# Patient Record
Sex: Male | Born: 1980 | Race: White | Hispanic: No | Marital: Single | Smoking: Never smoker
Health system: Southern US, Community
[De-identification: ages and names within clinical notes are randomized; demographics above are authoritative.]

## PROBLEM LIST (undated history)

## (undated) DIAGNOSIS — E109 Type 1 diabetes mellitus without complications: Secondary | ICD-10-CM

## (undated) HISTORY — PX: NASAL SEPTUM SURGERY: SHX37

---

## 2014-08-27 ENCOUNTER — Encounter: Payer: Self-pay | Admitting: *Deleted

## 2014-08-27 ENCOUNTER — Ambulatory Visit
Admission: EM | Admit: 2014-08-27 | Discharge: 2014-08-27 | Disposition: A | Discharge status: Home / Self Care | Payer: BLUE CROSS/BLUE SHIELD | Attending: Family Medicine | Admitting: Family Medicine

## 2014-08-27 DIAGNOSIS — R319 Hematuria, unspecified: Secondary | ICD-10-CM | POA: Diagnosis present

## 2014-08-27 DIAGNOSIS — M549 Dorsalgia, unspecified: Secondary | ICD-10-CM | POA: Diagnosis present

## 2014-08-27 DIAGNOSIS — M545 Low back pain, unspecified: Secondary | ICD-10-CM

## 2014-08-27 DIAGNOSIS — Z8052 Family history of malignant neoplasm of bladder: Secondary | ICD-10-CM

## 2014-08-27 LAB — URINALYSIS COMPLETE WITH MICROSCOPIC (ARMC ONLY)
Bilirubin Urine: NEGATIVE
Glucose, UA: NEGATIVE mg/dL
Hgb urine dipstick: NEGATIVE
Ketones, ur: NEGATIVE mg/dL
Leukocytes, UA: NEGATIVE
Nitrite: NEGATIVE
PH: 6 (ref 5.0–8.0)
Protein, ur: NEGATIVE mg/dL
Specific Gravity, Urine: 1.005 (ref 1.005–1.030)

## 2014-08-27 NOTE — ED Notes (Signed)
Pt states "ive had upset stomach, blood in urine and lower back pain, started about 2 days ago, blood in urine started today"

## 2014-09-01 ENCOUNTER — Encounter: Payer: Self-pay | Admitting: Physician Assistant

## 2014-09-01 NOTE — ED Provider Notes (Signed)
CSN: [id number]     Arrival date & time 08/27/14  1934 History   None    Chief Complaint  Patient presents with  . Back Pain  . Hematuria   (Consider location/radiation/quality/duration/timing/severity/associated sxs/prior Treatment) HPI33 yo M presents with his wife reporting low back aching and blood in his urine. He came in at her urging. Upset stomach was in passing- no change appetite, no vomiting, diarrhea or abdominal pain. Saw pink in urine this morning   History reviewed. No pertinent past medical history. Past Surgical History  Procedure Laterality Date  . Nasal septum surgery     History reviewed. No pertinent family history. History  Substance Use Topics  . Smoking status: Never Smoker   . Smokeless tobacco: Not on file  . Alcohol Use: Yes     Comment: social     Review of Systems Constitutional -afebrile Eyes-denies visual changes ENT- normal voice,denies sore throat CV-denies chest pain Resp-denies SOB GI- negative for nausea,vomiting, diarrhea GU- negative for dysuria MSK- positivie for  Low back pain, ambulatory Skin- denies acute changes Neuro- negative headache,focal weakness or numbness MSK- negative, ambulatory   Allergies  Review of patient's allergies indicates no known allergies.  Home Medications   Prior to Admission medications   Not on File   BP 115/76 mmHg  Pulse 62  Temp(Src) 97.8 F (36.6 C) (Tympanic)  Resp 16  Ht 5\' 9"  (1.753 m)  Wt 157 lb (71.215 kg)  BMI 23.17 kg/m2  SpO2 99% Physical Exam   Constitutional -alert and oriented,well appearing and in no acute distress Head-atraumatic, normocephalic Eyes- conjunctiva normal, EOMI ,conjugate gaze Nose- no congestion or rhinorrhea Mouth/throat- mucous membranes moist ,oropharynx non-erythematous Neck- supple without glandular enlargement CV- regular rate, grossly normal heart sounds,  Resp-no distress, normal respiratory effort,clear to auscultation bilaterally Back- No CVAT,  bilateral low back muscle tenderness to palpation; SLRs neg, DTRs equal bilat GI- soft,non-tender,no distention GU- not examined MSK- no tender, normal ROM, all extremities, ambulatory, self-care Neuro- normal speech and language, no gross focal neurological deficit appreciated, no gait instability, Skin-warm,dry ,intact; no rash noted Psych-mood and affect grossly normal; speech and behavior grossly normal ED Course  Procedures (including critical care time) Labs Review Labs Reviewed  URINALYSIS COMPLETEWITH MICROSCOPIC ([facility] ONLY) - Abnormal; Notable for the following:    Squamous Epithelial / LPF 0-5 (*)    All other components within normal limits    Imaging Review No results found.  Low back muscle discomfort bilateral,equal, mild identified. Normal and negative urinalysis reviewed with the couple at which time a tumble of information evolved with multiple family members reported as having bladder cancer,some at a relatively young age.  They are reasurred that his physical exam today did not indicate kidney type pain and the urine was clear. They are however strongly encouraged to get some real information from parents and peripheral relatives- is there a syndrome identified ? Has genetic testing been done? Which family members have been affected and in what way ? He is also given urine specimen cups-should he note hematuria on a subsequent void request he catch a specimen, transport it to his PCP office for evaluation .Have also asked them to discuss a Urology referral with PCP to review the information they gather from family members and to recommend diagnostics  if indicated. Encourage them to be proactive in data gathering and baseline consultations        1. Bilateral low back pain without sciatica   2. FHx: bladder  cancer    Greater than half of today's visit was spent in face to face consultation with the patient and his spouse. Information,patient education, coordination  of care and recommendations reviewed. Diagnosis and treatment discussed- low back, ibuprofen, stretches, heat/ice, exercise. Possible hematuria and family history as noted above. Questions fielded, expectations and recommendations reviewed. Patient expresses understanding. Will return to [facility] with questions, concern or exacerbation.     Rae Halsted, PA-C 09/01/14 1510

## 2020-04-08 ENCOUNTER — Other Ambulatory Visit: Payer: Self-pay

## 2020-04-08 ENCOUNTER — Ambulatory Visit
Admission: EM | Admit: 2020-04-08 | Discharge: 2020-04-08 | Disposition: A | Discharge status: Home / Self Care | Payer: BLUE CROSS/BLUE SHIELD | Attending: Family Medicine | Admitting: Family Medicine

## 2020-04-08 DIAGNOSIS — M545 Low back pain, unspecified: Secondary | ICD-10-CM | POA: Diagnosis present

## 2020-04-08 HISTORY — DX: Type 1 diabetes mellitus without complications: E10.9

## 2020-04-08 LAB — URINALYSIS, COMPLETE (UACMP) WITH MICROSCOPIC
Bacteria, UA: NONE SEEN
Bilirubin Urine: NEGATIVE
Glucose, UA: >1000 mg/dL — AB
Hgb urine dipstick: NEGATIVE
Ketones, ur: NEGATIVE mg/dL
Leukocytes,Ua: NEGATIVE
Nitrite: NEGATIVE
Protein, ur: NEGATIVE mg/dL
Specific Gravity, Urine: 1.025 (ref 1.005–1.030)
Squamous Epithelial / LPF: NONE SEEN (ref 0–5)
pH: 6.5 (ref 5.0–8.0)

## 2020-04-08 MED ORDER — MELOXICAM 15 MG PO TABS: 15.0000 mg | ORAL_TABLET | Freq: Every day | ORAL | 0 refills | Status: DC | PRN

## 2020-04-08 MED ORDER — TIZANIDINE HCL 4 MG PO TABS: 4.0000 mg | ORAL_TABLET | Freq: Three times a day (TID) | ORAL | 0 refills | Status: DC | PRN

## 2020-04-08 NOTE — Discharge Instructions (Signed)
Rest.  Heat.  Medication as prescribed.  Take care  Dr. Kimo Bancroft  

## 2020-04-08 NOTE — ED Provider Notes (Signed)
[facility]    CSN: [id number] Arrival date & time: 04/08/20  1215      History   Chief Complaint Chief Complaint  Patient presents with  . Back Pain   HPI  40 year old male presents with back pain.  Started on Wednesday.  He reports bilateral low back pain.  No radicular symptoms.  No fall, trauma, injury.  No heavy lifting.  Patient concerned about the possibility of UTI/kidney issues as he is diabetic.  He would like his urine checked today.  He has no urinary symptoms.  He has taken naproxen without relief.  No other complaints at this time.  Past Medical History:  Diagnosis Date  . Diabetes type 1, controlled (HCC)    Past Surgical History:  Procedure Laterality Date  . NASAL SEPTUM SURGERY     Home Medications    Prior to Admission medications   Medication Sig Start Date End Date Taking? Authorizing Provider  meloxicam (MOBIC) 15 MG tablet Take 1 tablet (15 mg total) by mouth daily as needed for pain. 04/08/20  Yes Mack Alvidrez G, DO  metFORMIN (GLUCOPHAGE) 500 MG tablet Take 1 tablet by mouth in the morning and at bedtime. 12/29/18  Yes [provider]  tiZANidine (ZANAFLEX) 4 MG tablet Take 1 tablet (4 mg total) by mouth every 8 (eight) hours as needed for muscle spasms. 04/08/20  Yes Senai Kingsley G, DO  TRESIBA FLEXTOUCH 100 UNIT/ML FlexTouch Pen Inject 20 Units into the skin in the morning. 03/12/20  Yes [provider]    Family History No family history on file.  Social History Social History   Tobacco Use  . Smoking status: Never Smoker  . Smokeless tobacco: Never Used  Vaping Use  . Vaping Use: Never used  Substance Use Topics  . Alcohol use: Yes    Comment: social      Allergies   Patient has no known allergies.   Review of Systems Review of Systems  Constitutional: Negative.   Musculoskeletal: Positive for back pain.   Physical Exam Triage Vital Signs ED Triage Vitals  Enc Vitals Group     BP 04/08/20 1238  116/71     Pulse Rate 04/08/20 1238 61     Resp 04/08/20 1238 18     Temp 04/08/20 1238 98.4 F (36.9 C)     Temp Source 04/08/20 1238 Oral     SpO2 04/08/20 1238 99 %     Weight 04/08/20 1235 165 lb (74.8 kg)     Height 04/08/20 1235 5\' 9"  (1.753 m)     Head Circumference --      Peak Flow --      Pain Score 04/08/20 1235 6     Pain Loc --      Pain Edu? --      Excl. in GC? --    No data found.  Updated Vital Signs BP 116/71 (BP Location: Left Arm)   Pulse 61   Temp 98.4 F (36.9 C) (Oral)   Resp 18   Ht 5\' 9"  (1.753 m)   Wt 74.8 kg   SpO2 99%   BMI 24.37 kg/m   Visual Acuity Right Eye Distance:   Left Eye Distance:   Bilateral Distance:    Right Eye Near:   Left Eye Near:    Bilateral Near:     Physical Exam Constitutional:      General: He is not in acute distress.    Appearance: Normal appearance. He  is not ill-appearing.  HENT:     Head: Normocephalic and atraumatic.  Cardiovascular:     Rate and Rhythm: Normal rate and regular rhythm.     Heart sounds: No murmur heard.   Pulmonary:     Effort: Pulmonary effort is normal.     Breath sounds: Normal breath sounds. No wheezing, rhonchi or rales.  Musculoskeletal:     Comments: Negative straight leg raise  Neurological:     Mental Status: He is alert.  Psychiatric:        Mood and Affect: Mood normal.        Behavior: Behavior normal.    UC Treatments / Results  Labs (all labs ordered are listed, but only abnormal results are displayed) Labs Reviewed  URINALYSIS, COMPLETE (UACMP) WITH MICROSCOPIC - Abnormal; Notable for the following components:      Result Value   Color, Urine STRAW (*)    Glucose, UA >1,000 (*)    All other components within normal limits    EKG   Radiology No results found.  Procedures Procedures (including critical care time)  Medications Ordered in UC Medications - No data to display  Initial Impression / Assessment and Plan / UC Course  I have reviewed the  triage vital signs and the nursing notes.  Pertinent labs & imaging results that were available during my care of the patient were reviewed by me and considered in my medical decision making (see chart for details).    40 year old male presents with back pain.  Urinalysis not consistent with UTI.  Glucosuria was noted.  Advised that he needs to increase his insulin.  Needs follow-up with endocrinology.  Treating low back pain with Mobic and Zanaflex.  Final Clinical Impressions(s) / UC Diagnoses   Final diagnoses:  Acute bilateral low back pain without sciatica     Discharge Instructions     Rest.  Heat.  Medication as prescribed.  Take care  Dr. Adriana Simas    ED Prescriptions    Medication Sig Dispense Auth. Provider   meloxicam (MOBIC) 15 MG tablet Take 1 tablet (15 mg total) by mouth daily as needed for pain. 30 tablet Paola Flynt G, DO   tiZANidine (ZANAFLEX) 4 MG tablet Take 1 tablet (4 mg total) by mouth every 8 (eight) hours as needed for muscle spasms. 30 tablet Tommie Sams, DO     PDMP not reviewed this encounter.   Tommie Sams, [place] 04/08/20 1404

## 2020-04-08 NOTE — ED Triage Notes (Signed)
Pt c/o lower back pain radiating into both hips since this past Wednesday. Pt states sitting increased his pain level. Pt denies any urinary symptoms. Pt denies any known injuries or heavy lifting. Pt is DM1 and is concerned about his kidneys.

## 2020-04-14 ENCOUNTER — Ambulatory Visit
Admission: EM | Admit: 2020-04-14 | Discharge: 2020-04-14 | Disposition: A | Discharge status: Home / Self Care | Payer: BLUE CROSS/BLUE SHIELD | Attending: Family Medicine | Admitting: Family Medicine

## 2020-04-14 ENCOUNTER — Ambulatory Visit (INDEPENDENT_AMBULATORY_CARE_PROVIDER_SITE_OTHER): Payer: BLUE CROSS/BLUE SHIELD

## 2020-04-14 ENCOUNTER — Other Ambulatory Visit: Payer: Self-pay

## 2020-04-14 DIAGNOSIS — N50819 Testicular pain, unspecified: Secondary | ICD-10-CM | POA: Insufficient documentation

## 2020-04-14 DIAGNOSIS — N50811 Right testicular pain: Secondary | ICD-10-CM | POA: Insufficient documentation

## 2020-04-14 DIAGNOSIS — N451 Epididymitis: Secondary | ICD-10-CM | POA: Insufficient documentation

## 2020-04-14 LAB — URINALYSIS, COMPLETE (UACMP) WITH MICROSCOPIC
Bacteria, UA: NONE SEEN
Bilirubin Urine: NEGATIVE
Glucose, UA: 100 mg/dL — AB
Hgb urine dipstick: NEGATIVE
Ketones, ur: NEGATIVE mg/dL
Leukocytes,Ua: NEGATIVE
Nitrite: NEGATIVE
Protein, ur: NEGATIVE mg/dL
Specific Gravity, Urine: 1.02 (ref 1.005–1.030)
Squamous Epithelial / LPF: NONE SEEN (ref 0–5)
WBC, UA: NONE SEEN WBC/hpf (ref 0–5)
pH: 5.5 (ref 5.0–8.0)

## 2020-04-14 MED ORDER — LEVOFLOXACIN 500 MG PO TABS: 500.0000 mg | ORAL_TABLET | Freq: Every day | ORAL | 0 refills | Status: DC

## 2020-04-14 NOTE — ED Provider Notes (Signed)
[facility]    CSN: [id number] Arrival date & time: 04/14/20  0941      History   Chief Complaint Chief Complaint  Patient presents with  . Testicle Pain    right   HPI   40 year old male presents with right testicle pain.  Patient recently seen for low back pain. Patient reports that this improved but then worsened again as of yesterday. He has now developed right testicle pain and swelling of the past 2 days. States that his pain is severe. Worse with any sort of pressure or touch applied to the right testicle. No relieving factors. No fever. No urinary symptoms. No other complaints.  Past Medical History:  Diagnosis Date  . Diabetes type 1, controlled (HCC)    Past Surgical History:  Procedure Laterality Date  . NASAL SEPTUM SURGERY     Home Medications    Prior to Admission medications   Medication Sig Start Date End Date Taking? Authorizing Provider  levofloxacin (LEVAQUIN) 500 MG tablet Take 1 tablet (500 mg total) by mouth daily. 04/14/20  Yes Ricketta Colantonio G, DO  meloxicam (MOBIC) 15 MG tablet Take 1 tablet (15 mg total) by mouth daily as needed for pain. 04/08/20   Tommie Sams, DO  metFORMIN (GLUCOPHAGE) 500 MG tablet Take 1 tablet by mouth in the morning and at bedtime. 12/29/18   [provider]  tiZANidine (ZANAFLEX) 4 MG tablet Take 1 tablet (4 mg total) by mouth every 8 (eight) hours as needed for muscle spasms. 04/08/20   Jackolyn Geron G, DO  TRESIBA FLEXTOUCH 100 UNIT/ML FlexTouch Pen Inject 20 Units into the skin in the morning. 03/12/20   [provider]   Social History Social History   Tobacco Use  . Smoking status: Never Smoker  . Smokeless tobacco: Never Used  Vaping Use  . Vaping Use: Never used  Substance Use Topics  . Alcohol use: Yes    Comment: social      Allergies   Patient has no known allergies.   Review of Systems Review of Systems  Genitourinary: Positive for testicular pain.  Musculoskeletal:  Positive for back pain.   Physical Exam Triage Vital Signs ED Triage Vitals  Enc Vitals Group     BP 04/14/20 1005 123/75     Pulse Rate 04/14/20 1005 (!) 55     Resp 04/14/20 1005 18     Temp 04/14/20 1005 98 F (36.7 C)     Temp Source 04/14/20 1005 Oral     SpO2 04/14/20 1005 100 %     Weight 04/14/20 1004 165 lb (74.8 kg)     Height 04/14/20 1004 5\' 9"  (1.753 m)     Head Circumference --      Peak Flow --      Pain Score 04/14/20 1003 10     Pain Loc --      Pain Edu? --      Excl. in GC? --    Updated Vital Signs BP 123/75 (BP Location: Left Arm)   Pulse (!) 55   Temp 98 F (36.7 C) (Oral)   Resp 18   Ht 5\' 9"  (1.753 m)   Wt 74.8 kg   SpO2 100%   BMI 24.37 kg/m   Visual Acuity Right Eye Distance:   Left Eye Distance:   Bilateral Distance:    Right Eye Near:   Left Eye Near:    Bilateral Near:     Physical Exam Vitals and  nursing note reviewed.  Constitutional:      General: He is not in acute distress.    Appearance: Normal appearance. He is not ill-appearing.  HENT:     Head: Normocephalic and atraumatic.  Eyes:     General:        Right eye: No discharge.        Left eye: No discharge.     Conjunctiva/sclera: Conjunctivae normal.  Pulmonary:     Effort: Pulmonary effort is normal.     Breath sounds: Normal breath sounds. No wheezing, rhonchi or rales.  Genitourinary:    Comments: Right testicle with tenderness to palpation. Patient with tenderness over the epididymis. Mild swelling. Neurological:     Mental Status: He is alert.  Psychiatric:        Mood and Affect: Mood normal.        Behavior: Behavior normal.    UC Treatments / Results  Labs (all labs ordered are listed, but only abnormal results are displayed) Labs Reviewed  URINALYSIS, COMPLETE (UACMP) WITH MICROSCOPIC - Abnormal; Notable for the following components:      Result Value   Glucose, UA 100 (*)    All other components within normal limits  URINE CULTURE     EKG   Radiology No results found.  Procedures Procedures (including critical care time)  Medications Ordered in UC Medications - No data to display  Initial Impression / Assessment and Plan / UC Course  I have reviewed the triage vital signs and the nursing notes.  Pertinent labs & imaging results that were available during my care of the patient were reviewed by me and considered in my medical decision making (see chart for details).    40 year old male presents with right testicular pain. Ultrasound revealed epididymitis. Treating with Levaquin. Supportive care. Work note given.  Final Clinical Impressions(s) / UC Diagnoses   Final diagnoses:  Testicle pain  Epididymitis   Discharge Instructions   None    ED Prescriptions    Medication Sig Dispense Auth. Provider   levofloxacin (LEVAQUIN) 500 MG tablet Take 1 tablet (500 mg total) by mouth daily. 10 tablet Tommie Sams, DO     PDMP not reviewed this encounter.   Tommie Sams, [place] 04/14/20 1122

## 2020-04-14 NOTE — ED Triage Notes (Signed)
Pt c/o continued intermittent lower back pain and now has developed groin pain/pressure mostly in the right testicle. Pt also reports swelling and a lump on the back side. Pt denies f/n/v/d or other symptoms.

## 2020-04-16 LAB — URINE CULTURE: Culture: NO GROWTH

## 2020-05-26 ENCOUNTER — Telehealth: Payer: Self-pay | Admitting: Family Medicine

## 2020-05-26 MED ORDER — LEVOFLOXACIN 500 MG PO TABS
500.0000 mg | ORAL_TABLET | Freq: Every day | ORAL | 0 refills | Status: DC
Start: 2020-05-26 — End: 2022-03-07

## 2020-05-26 NOTE — Telephone Encounter (Signed)
Recurrent testicle pain concerning for epididymitis. Starting Levaquin. If continues to have trouble, needs to see Urology.  Everlene Other DO [facility]

## 2021-06-17 IMAGING — US US SCROTUM W/ DOPPLER COMPLETE
1 series · 14 of 25 positions shown · non-contrast
Comparison: None.

CLINICAL DATA: Right scrotal pain, lump

EXAM:
SCROTAL ULTRASOUND
DOPPLER ULTRASOUND OF THE TESTICLES
TECHNIQUE: Complete ultrasound examination of the testicles, epididymis, and
other scrotal structures was performed. Color and spectral Doppler
ultrasound were also utilized to evaluate blood flow to the
testicles.

[Series 1: us scrotum w/ doppler complete · 0.08mm/px · 14 of 45 slices shown]
[im 1/45]
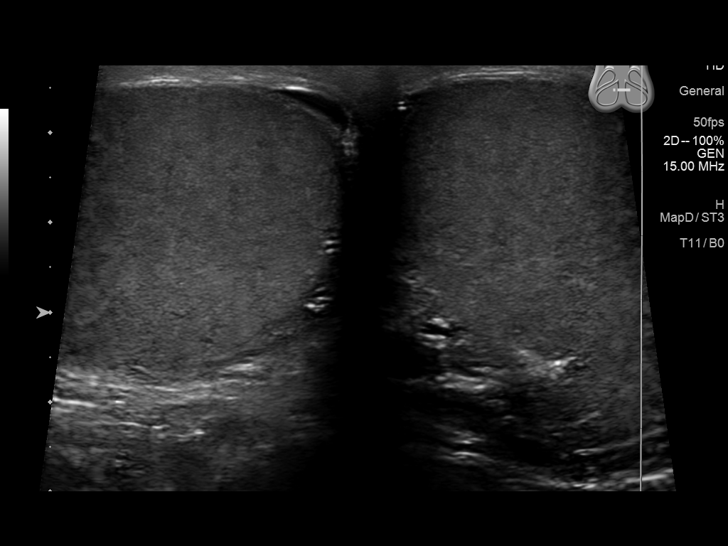
[im 4/45]
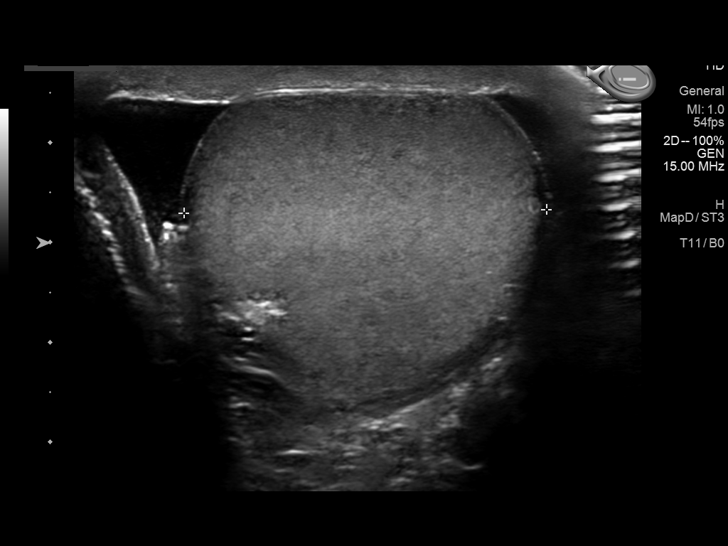
[im 8/45]
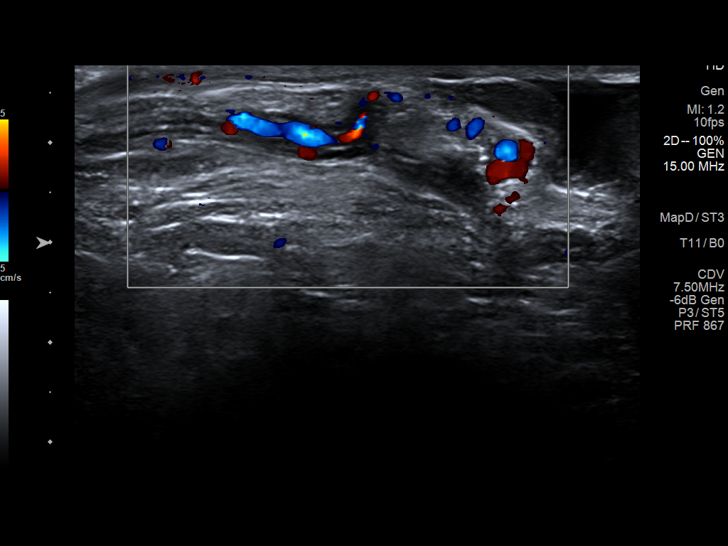
[im 12/45]
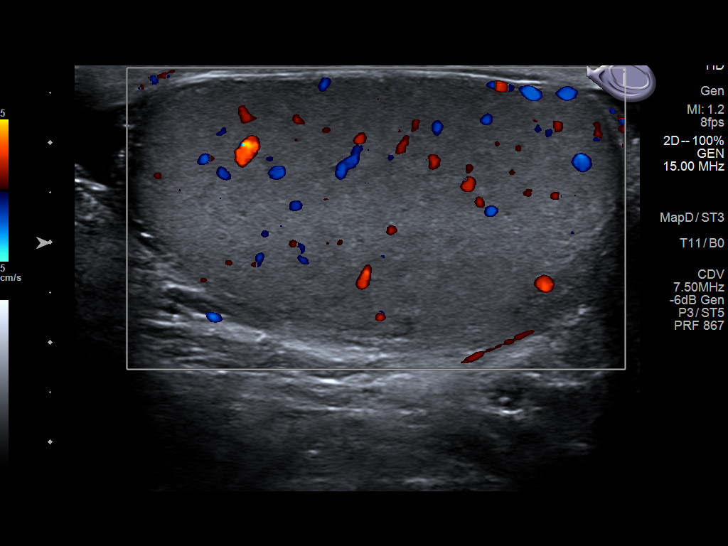
[im 15/45]
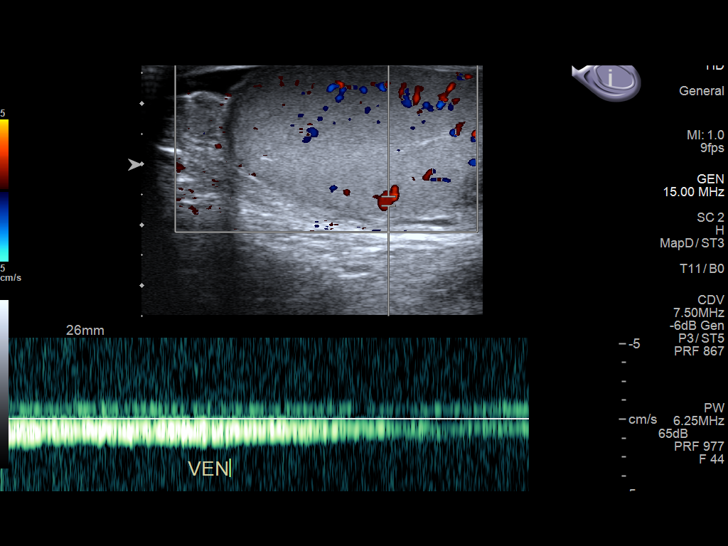
[im 17/45]
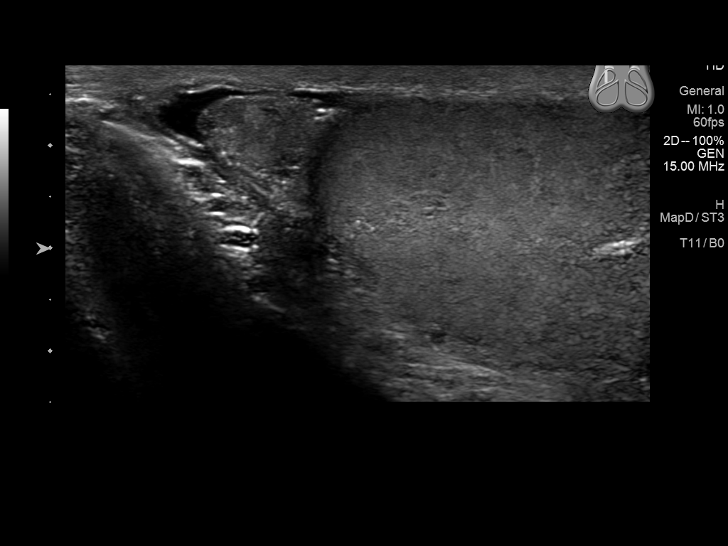
[im 21/45]
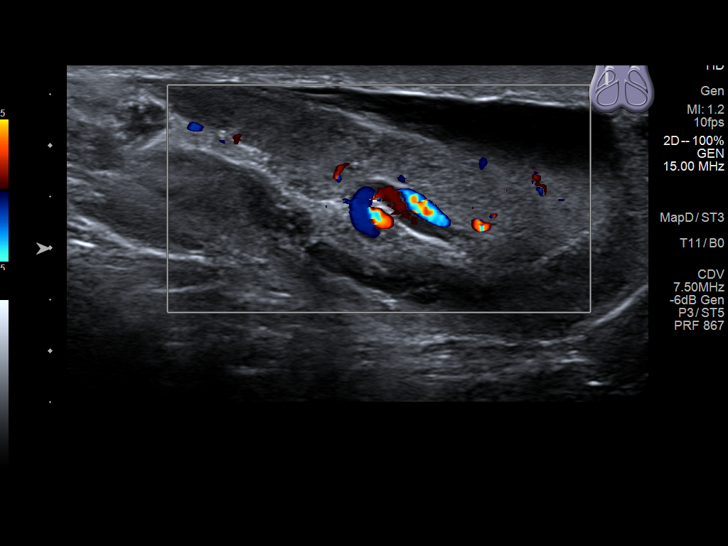
[im 24/45]
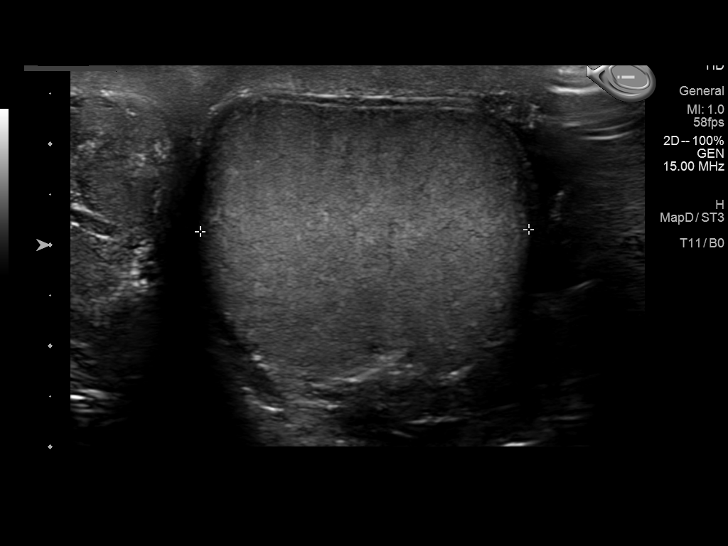
[im 28/45]
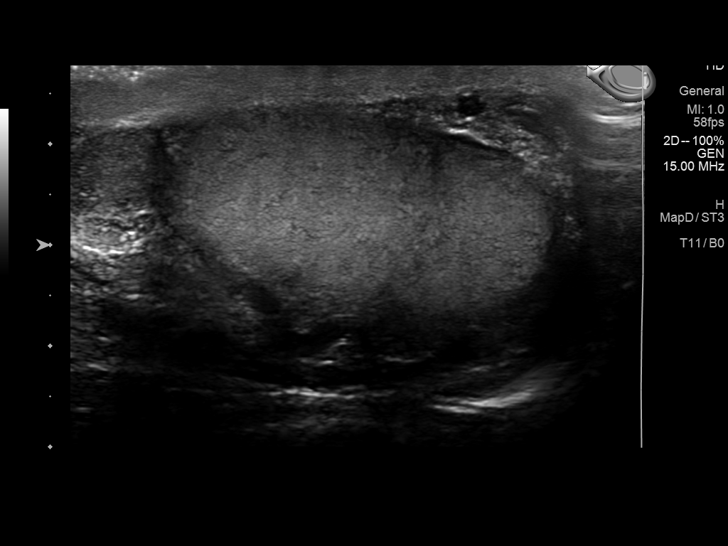
[im 30/45]
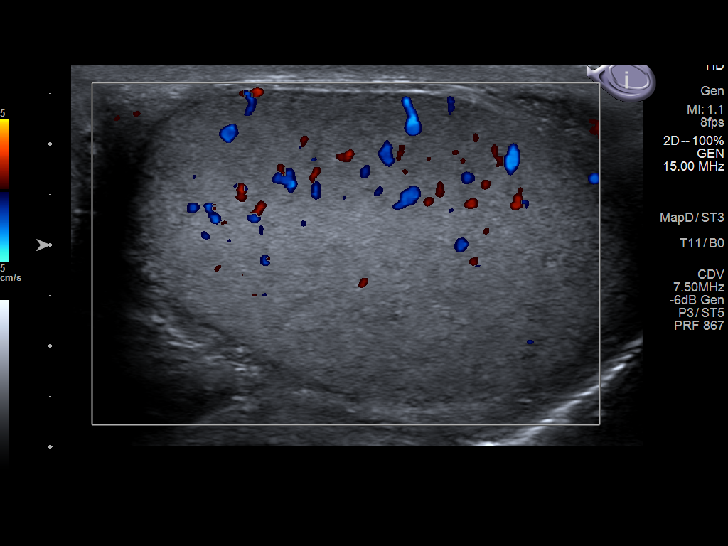
[im 34/45]
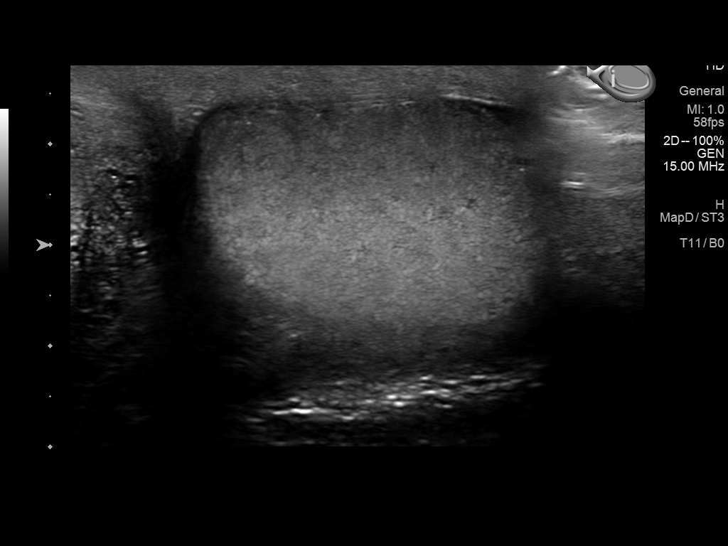
[im 37/45]
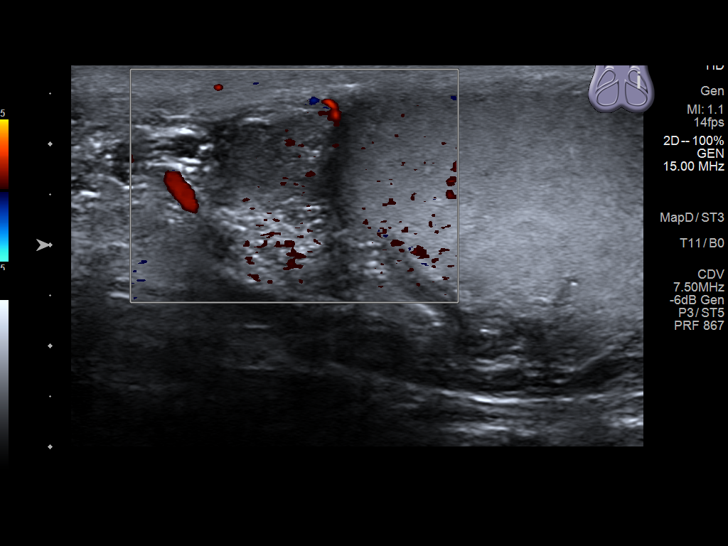
[im 41/45]
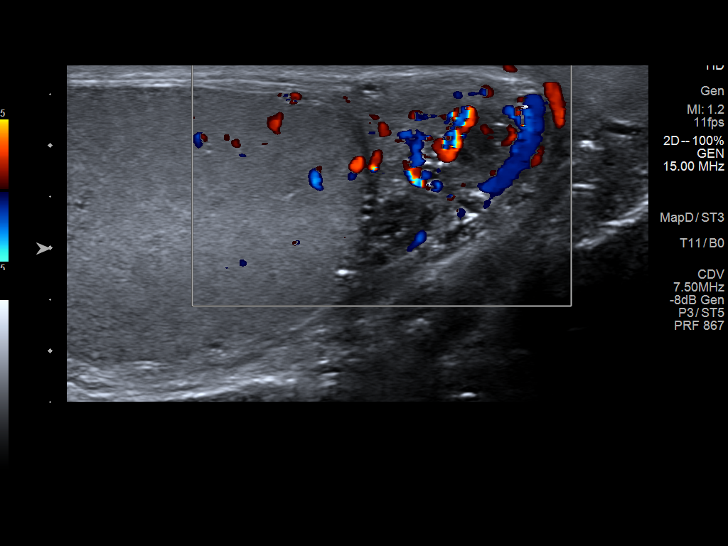
[im 45/45]
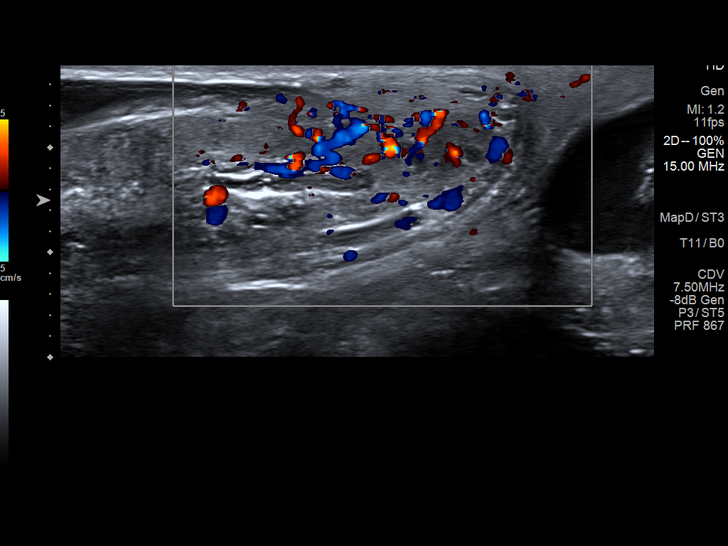

[14 of 25 positions shown; findings below may reference images not displayed]

FINDINGS: Right testicle

Measurements: 4.8 x 2.8 x 3.6 cm. No mass or microlithiasis
visualized.

Left testicle

Measurements: 5.0 x 3.1 x 3.3 cm. No mass or microlithiasis
visualized.

Right epididymis: Increased vascularity within the tail of the
epididymis which is prominent, question epididymitis.

Left epididymis:  Normal in size and appearance.

Hydrocele:  Small bilateral

Varicocele:  None visualized.

Pulsed Doppler interrogation of both testes demonstrates normal low
resistance arterial and venous waveforms bilaterally.
IMPRESSION: Prominent epididymal tail on the right with increased vascularity
concerning for epididymitis.

No focal testicular abnormality or evidence of torsion.

Small bilateral hydroceles.

## 2022-03-07 ENCOUNTER — Ambulatory Visit
Admission: EM | Admit: 2022-03-07 | Discharge: 2022-03-07 | Disposition: A | Discharge status: Home / Self Care | Payer: BLUE CROSS/BLUE SHIELD | Attending: Physician Assistant | Admitting: Physician Assistant

## 2022-03-07 DIAGNOSIS — R0981 Nasal congestion: Secondary | ICD-10-CM | POA: Diagnosis not present

## 2022-03-07 DIAGNOSIS — R051 Acute cough: Secondary | ICD-10-CM | POA: Diagnosis not present

## 2022-03-07 DIAGNOSIS — U071 COVID-19: Secondary | ICD-10-CM | POA: Insufficient documentation

## 2022-03-07 LAB — SARS CORONAVIRUS 2 BY RT PCR: SARS Coronavirus 2 by RT PCR: POSITIVE — AB

## 2022-03-07 MED ORDER — MOLNUPIRAVIR 200 MG PO CAPS: 4.0000 | ORAL_CAPSULE | Freq: Two times a day (BID) | ORAL | 0 refills | Status: AC

## 2022-03-07 MED ORDER — BENZONATATE 200 MG PO CAPS: 200.0000 mg | ORAL_CAPSULE | Freq: Three times a day (TID) | ORAL | 0 refills | Status: AC | PRN

## 2022-03-07 NOTE — ED Provider Notes (Signed)
[facility]    CSN: [id number] Arrival date & time: 03/07/22  0806      History   Chief Complaint Chief Complaint  Patient presents with   Cough   Nasal Congestion    HPI Jesus Shepherd is a 42 y.o. male presenting for fatigue, cough, congestion that began yesterday.  He has been taking ibuprofen so he has not noticed a fever.  He denies sore throat or shortness of breath.  He reports he was exposed to the flu last week by his daughter.  He has been taking over-the-counter medication for symptoms.  He is a type I diabetic.  No other complaints or concerns.  HPI  Past Medical History:  Diagnosis Date   Diabetes type 1, controlled (HCC)     There are no problems to display for this patient.   Past Surgical History:  Procedure Laterality Date   NASAL SEPTUM SURGERY         Home Medications    Prior to Admission medications   Medication Sig Start Date End Date Taking? Authorizing Provider  benzonatate (TESSALON) 200 MG capsule Take 1 capsule (200 mg total) by mouth 3 (three) times daily as needed for cough. 03/07/22  Yes Eusebio Friendly B, PA-C  Continuous Blood Gluc Sensor (DEXCOM G6 SENSOR) MISC Replace sensor every 10 days 02/27/22  Yes [provider]  molnupiravir EUA (LAGEVRIO) 200 MG CAPS capsule Take 4 capsules (800 mg total) by mouth 2 (two) times daily for 5 days. 03/07/22 03/12/22 Yes Shirlee Latch, PA-C  metFORMIN (GLUCOPHAGE) 500 MG tablet Take 1 tablet by mouth in the morning and at bedtime. 12/29/18   [provider]  TRESIBA FLEXTOUCH 100 UNIT/ML FlexTouch Pen Inject 20 Units into the skin in the morning. 03/12/20   [provider]    Family History History reviewed. No pertinent family history.  Social History Social History   Tobacco Use   Smoking status: Never   Smokeless tobacco: Never  Vaping Use   Vaping Use: Never used  Substance Use Topics   Alcohol use: Yes    Comment: social      Allergies   Patient  has no known allergies.   Review of Systems Review of Systems  Constitutional:  Positive for fatigue. Negative for fever.  HENT:  Positive for congestion and rhinorrhea. Negative for sinus pressure, sinus pain and sore throat.   Respiratory:  Positive for cough. Negative for shortness of breath.   Gastrointestinal:  Negative for abdominal pain, diarrhea, nausea and vomiting.  Musculoskeletal:  Negative for myalgias.  Neurological:  Negative for weakness, light-headedness and headaches.  Hematological:  Negative for adenopathy.     Physical Exam Triage Vital Signs ED Triage Vitals  Enc Vitals Group     BP 03/07/22 0826 137/85     Pulse Rate 03/07/22 0826 85     Resp 03/07/22 0826 16     Temp 03/07/22 0826 98.6 F (37 C)     Temp Source 03/07/22 0826 Oral     SpO2 03/07/22 0826 96 %     Weight --      Height --      Head Circumference --      Peak Flow --      Pain Score 03/07/22 0825 0     Pain Loc --      Pain Edu? --      Excl. in GC? --    No data found.  Updated Vital Signs BP  137/85 (BP Location: Right Arm)   Pulse 85   Temp 98.6 F (37 C) (Oral)   Resp 16   SpO2 96%       Physical Exam Vitals and nursing note reviewed.  Constitutional:      General: He is not in acute distress.    Appearance: Normal appearance. He is well-developed.  HENT:     Head: Normocephalic and atraumatic.     Nose: Congestion present.     Mouth/Throat:     Mouth: Mucous membranes are moist.     Pharynx: Oropharynx is clear. Posterior oropharyngeal erythema present.  Eyes:     General: No scleral icterus.    Conjunctiva/sclera: Conjunctivae normal.  Cardiovascular:     Rate and Rhythm: Normal rate and regular rhythm.     Heart sounds: Normal heart sounds.  Pulmonary:     Effort: Pulmonary effort is normal. No respiratory distress.     Breath sounds: Normal breath sounds.  Musculoskeletal:     Cervical back: Neck supple.  Skin:    General: Skin is warm and dry.      Capillary Refill: Capillary refill takes less than 2 seconds.  Neurological:     General: No focal deficit present.     Mental Status: He is alert. Mental status is at baseline.     Motor: No weakness.     Gait: Gait normal.  Psychiatric:        Mood and Affect: Mood normal.        Behavior: Behavior normal.      UC Treatments / Results  Labs (all labs ordered are listed, but only abnormal results are displayed) Labs Reviewed  SARS CORONAVIRUS 2 BY RT PCR - Abnormal; Notable for the following components:      Result Value   SARS Coronavirus 2 by RT PCR POSITIVE (*)    All other components within normal limits    EKG   Radiology No results found.  Procedures Procedures (including critical care time)  Medications Ordered in UC Medications - No data to display  Initial Impression / Assessment and Plan / UC Course  I have reviewed the triage vital signs and the nursing notes.  Pertinent labs & imaging results that were available during my care of the patient were reviewed by me and considered in my medical decision making (see chart for details).   42 year old type I diabetic presents for onset of fatigue, cough and congestion yesterday.  Unknown if having a fever.  Has been taking Motrin regularly.  He has been exposed to flu last week by his child.  Vitals normal and stable.  He is overall well-appearing.  On exam he has nasal congestion and mild posterior pharyngeal erythema.  Chest clear to auscultation.  PCR COVID test obtained given that he does not have fever.  If COVID-negative, will obtain flu test since he is high risk and still in the window for treatment with Tamiflu which could be helpful.  Positive COVID.  Reviewed current CDC guidelines, isolation protocol and ED precautions.  Treating with molnupiravir and benzonatate.  Advised close monitoring of symptoms and especially blood sugar.  He does have a continuous glucose monitor.  Reviewed return and ER  precautions.  Work note given.   Final Clinical Impressions(s) / UC Diagnoses   Final diagnoses:  COVID-19  Acute cough  Nasal congestion     Discharge Instructions      -COVID-positive.  You need isolate 5 days from symptom onset  and wear mask x 5 days.  Today is technically your first full day. - I sent antiviral medication to the pharmacy as well as cough medication.  Rest and increase your fluids.  Continue to monitor your blood sugars and alter medication accordingly. - You need to be seen again if you have any uncontrollable blood sugars, weakness, breathing difficulty.  Go to ER for any acute changes.     ED Prescriptions     Medication Sig Dispense Auth. Provider   molnupiravir EUA (LAGEVRIO) 200 MG CAPS capsule Take 4 capsules (800 mg total) by mouth 2 (two) times daily for 5 days. 40 capsule Laurene Footman B, PA-C   benzonatate (TESSALON) 200 MG capsule Take 1 capsule (200 mg total) by mouth 3 (three) times daily as needed for cough. 20 capsule Danton Clap, PA-C      PDMP not reviewed this encounter.   Danton Clap, PA-C 03/07/22 [phone]

## 2022-03-07 NOTE — ED Triage Notes (Signed)
Patient presents to UC for chest congestion, cough, and nasal congestion since yesterday. Taking ibuprofen.   Denies fever.

## 2022-03-07 NOTE — Discharge Instructions (Signed)
-  COVID-positive.  You need isolate 5 days from symptom onset and wear mask x 5 days.  Today is technically your first full day. - I sent antiviral medication to the pharmacy as well as cough medication.  Rest and increase your fluids.  Continue to monitor your blood sugars and alter medication accordingly. - You need to be seen again if you have any uncontrollable blood sugars, weakness, breathing difficulty.  Go to ER for any acute changes.
